# Patient Record
Sex: Male | Born: 1965 | Race: Black or African American | Hispanic: No | Marital: Single
Health system: Southern US, Community
[De-identification: ages and names within clinical notes are randomized; demographics above are authoritative.]

---

## 2015-09-05 ENCOUNTER — Emergency Department (HOSPITAL_COMMUNITY): Payer: Self-pay

## 2015-09-05 ENCOUNTER — Encounter (HOSPITAL_COMMUNITY): Payer: Self-pay | Admitting: *Deleted

## 2015-09-05 ENCOUNTER — Emergency Department (HOSPITAL_COMMUNITY)
Admission: EM | Admit: 2015-09-05 | Discharge: 2015-09-05 | Disposition: A | Discharge status: Home / Self Care | Payer: Self-pay | Attending: Emergency Medicine | Admitting: Emergency Medicine

## 2015-09-05 DIAGNOSIS — M25561 Pain in right knee: Secondary | ICD-10-CM

## 2015-09-05 DIAGNOSIS — M25551 Pain in right hip: Secondary | ICD-10-CM | POA: Insufficient documentation

## 2015-09-05 DIAGNOSIS — Z8781 Personal history of (healed) traumatic fracture: Secondary | ICD-10-CM | POA: Insufficient documentation

## 2015-09-05 DIAGNOSIS — R2241 Localized swelling, mass and lump, right lower limb: Secondary | ICD-10-CM | POA: Insufficient documentation

## 2015-09-05 DIAGNOSIS — M79661 Pain in right lower leg: Secondary | ICD-10-CM | POA: Insufficient documentation

## 2015-09-05 MED ORDER — IBUPROFEN 800 MG PO TABS: 800.0000 mg | ORAL_TABLET | Freq: Three times a day (TID) | ORAL | Status: AC

## 2015-09-05 MED ORDER — IBUPROFEN 400 MG PO TABS
800.0000 mg | ORAL_TABLET | Freq: Once | ORAL | Status: AC
  Administered 2015-09-05: 800 mg via ORAL
  Filled 2015-09-05: qty 2

## 2015-09-05 NOTE — ED Notes (Signed)
Pt is in stable condition upon d/c and ambulates from ED. 

## 2015-09-05 NOTE — ED Notes (Signed)
Patient given ice pack for ankle

## 2015-09-05 NOTE — Discharge Instructions (Signed)
1. Medications: alternate naprosyn or ibuprofen and tylenol for pain control, usual home medications 2. Treatment: rest, ice, elevate and use brace, drink plenty of fluids, gentle stretching 3. Follow Up: Please followup with orthopedics as directed or your PCP in 1 week if no improvement for discussion of your diagnoses and further evaluation after today's visit; if you do not have a primary care doctor use the resource guide provided to find one; Please return to the ER for worsening symptoms or other concerns    Arthralgia Your caregiver has diagnosed you as suffering from an arthralgia. Arthralgia means there is pain in a joint. This can come from many reasons including:  Bruising the joint which causes soreness (inflammation) in the joint.  Wear and tear on the joints which occur as we grow older (osteoarthritis).  Overusing the joint.  Various forms of arthritis.  Infections of the joint. Regardless of the cause of pain in your joint, most of these different pains respond to anti-inflammatory drugs and rest. The exception to this is when a joint is infected, and these cases are treated with antibiotics, if it is a bacterial infection. HOME CARE INSTRUCTIONS   Rest the injured area for as long as directed by your caregiver. Then slowly start using the joint as directed by your caregiver and as the pain allows. Crutches as directed may be useful if the ankles, knees or hips are involved. If the knee was splinted or casted, continue use and care as directed. If an stretchy or elastic wrapping bandage has been applied today, it should be removed and re-applied every 3 to 4 hours. It should not be applied tightly, but firmly enough to keep swelling down. Watch toes and feet for swelling, bluish discoloration, coldness, numbness or excessive pain. If any of these problems (symptoms) occur, remove the ace bandage and re-apply more loosely. If these symptoms persist, contact your caregiver or  return to this location.  For the first 24 hours, keep the injured extremity elevated on pillows while lying down.  Apply ice for 15-20 minutes to the sore joint every couple hours while awake for the first half day. Then 03-04 times per day for the first 48 hours. Put the ice in a plastic bag and place a towel between the bag of ice and your skin.  Wear any splinting, casting, elastic bandage applications, or slings as instructed.  Only take over-the-counter or prescription medicines for pain, discomfort, or fever as directed by your caregiver. Do not use aspirin immediately after the injury unless instructed by your physician. Aspirin can cause increased bleeding and bruising of the tissues.  If you were given crutches, continue to use them as instructed and do not resume weight bearing on the sore joint until instructed. Persistent pain and inability to use the sore joint as directed for more than 2 to 3 days are warning signs indicating that you should see a caregiver for a follow-up visit as soon as possible. Initially, a hairline fracture (break in bone) may not be evident on X-rays. Persistent pain and swelling indicate that further evaluation, non-weight bearing or use of the joint (use of crutches or slings as instructed), or further X-rays are indicated. X-rays may sometimes not show a small fracture until a week or 10 days later. Make a follow-up appointment with your own caregiver or one to whom we have referred you. A radiologist (specialist in reading X-rays) may read your X-rays. Make sure you know how you are to obtain your  X-ray results. Do not assume everything is normal if you do not hear from [place]. SEEK MEDICAL CARE IF: Bruising, swelling, or pain increases. SEEK IMMEDIATE MEDICAL CARE IF:   Your fingers or toes are numb or blue.  The pain is not responding to medications and continues to stay the same or get worse.  The pain in your joint becomes severe.  You develop a fever  over 102 F (38.9 C).  It becomes impossible to move or use the joint. MAKE SURE YOU:   Understand these instructions.  Will watch your condition.  Will get help right away if you are not doing well or get worse. Document Released: 11/24/2005 Document Revised: 02/16/2012 Document Reviewed: 07/12/2008 [facility] Patient Information 2015 [place], [place]. This information is not intended to replace advice given to you by your health care provider. Make sure you discuss any questions you have with your health care provider.    Emergency Department Resource Guide 1) Find a Doctor and Pay Out of Pocket Although you won't have to find out who is covered by your insurance plan, it is a good idea to ask around and get recommendations. You will then need to call the office and see if the doctor you have chosen will accept you as a new patient and what types of options they offer for patients who are self-pay. Some doctors offer discounts or will set up payment plans for their patients who do not have insurance, but you will need to ask so you aren't surprised when you get to your appointment.  2) Contact Your Local Health Department Not all health departments have doctors that can see patients for sick visits, but many do, so it is worth a call to see if yours does. If you don't know where your local health department is, you can check in your phone book. The CDC also has a tool to help you locate your state's health department, and many state websites also have listings of all of their local health departments.  3) Find a Walk-in Clinic If your illness is not likely to be very severe or complicated, you may want to try a walk in clinic. These are popping up all over the country in pharmacies, drugstores, and shopping centers. They're usually staffed by nurse practitioners or physician assistants that have been trained to treat common illnesses and complaints. They're usually fairly quick and  inexpensive. However, if you have serious medical issues or chronic medical problems, these are probably not your best option.  No Primary Care Doctor: - Call Health Connect at  [phone] - they can help you locate a primary care doctor that  accepts your insurance, provides certain services, etc. - Physician Referral Service- [phone]  Chronic Pain Problems: Organization         Address  Phone   Notes  [facility]  [phone] Patients need to be referred by their primary care doctor.   Medication Assistance: Organization         Address  Phone   Notes  [facility] Medication [facility] [address] [place] [place], [place] [address] [phone] --Must be a resident of [facility] -- Must have NO insurance coverage whatsoever (no Medicaid/ Medicare, etc.) -- The pt. MUST have a primary care doctor that directs their care regularly and follows them in the community   [facility]  [phone]   [employer]  [phone]    Agencies that provide inexpensive medical  care: Organization         Address  Phone   Notes  [facility]  [phone]   [facility]    [phone]   [facility] [address] [place], [place] [address] [phone]   [facility] [address] [place] [phone]   Planned Parenthood    [phone]   [facility]    [phone]   [facility]  [address], [place] Phone:  [phone], Fax:  [phone] Hours of Operation:  9 am - 6 pm, M-F.  Also accepts Medicaid/Medicare and self-pay.  [facility]  [address], [place] Phone: [phone], Fax: [phone]. Hours of Operation:  8:30 am - 5:30 pm, M-F.  Also accepts Medicaid and self-pay.  [facility] [place] [address] [place] Phone: [phone]   [facility] [address] Natasha Bence [place], [place] [phone], Ext. [phone] Mondays & Thursdays: 7-9 AM.  First 15 patients are seen on a first come, first serve basis.    Medicaid-accepting [facility] Providers:  Organization         Address  Phone   Notes  [facility] [address],  [phone] Also accepts self-pay patients.  [facility] [address] Laurell Josephs [place], [place]  [phone]   [facility] [address], [place] [phone]   [facility] [address], [place] [phone]   Renaye Rakers [address], [place]   [phone] Only accepts [place] Access [place] patients after they have their name applied to their card.   Self-Pay (no insurance) in [place] [facility]:  Organization         Address  Phone   Notes  Sickle Cell Patients, [facility] [address] [place], [place] [phone]   [facility] [address] [place], [place] [phone]   [facility] [place]  [address], [place] [phone]   [facility]/Dr. Osei-Bonsu  [address], [place] or [address], [place] [phone] Phone number for both [place] and [place] locations is the same.  [facility] [address], [place] [phone]   [facility] [address], [place] or [address] [phone]   [facility] [address], [place] [phone], phone; [phone], fax Sees patients 1st and 3rd Saturday of every month.  Must not qualify for public or private insurance (i.e. Medicaid, Medicare, [place] Health Choice, Veterans' Benefits)  Household income should be no more than 200% of the poverty level The clinic cannot treat you if you are pregnant or  think you are pregnant  Sexually transmitted diseases are not treated at the clinic.    Dental Care: Organization         Address  Phone  Notes  [facility] [address] [place], [place] [phone] Accepts children up to age 34 who are enrolled in [place] or [place] Health Choice; pregnant women with a Medicaid card; and children who have applied for Medicaid or [place] Health Choice, but were  declined, whose parents can pay a reduced fee at time of service.  [facility] Department of [facility]  [address], [place] [phone] Accepts children up to age 2 who are enrolled in [place] or [place] Health Choice; pregnant women with a Medicaid card; and children who have applied for Medicaid or [place] Health Choice, but were declined, whose parents can pay a reduced fee at time of service.  [facility]  [address] [place], [place] [phone] Patients are seen by appointment only. Walk-ins are not accepted. [facility] will see patients 22 years of age and older. Monday - Tuesday (8am-5pm) Most Wednesdays (8:30-5pm) $30 per visit, cash only  [facility] Adult Dental Access PRO[facility]  [address], [facility] [phone] Patients are seen by appointment only. Walk-ins are not accepted. [facility] will see patients 30 years of age and older. One Wednesday Evening (Monthly: Volunteer Based).  $30 per visit, cash only  [employer]  [phone] for adults; Children under age 49, call Graduate Pediatric Dentistry at [phone]. Children aged 33-14, please call [phone] to request a pediatric application.  Dental services are provided in all areas of dental care including fillings, crowns and bridges, complete and partial dentures, implants, gum treatment, root canals, and extractions. Preventive care is also provided. Treatment is provided to both adults  and children. Patients are selected via a lottery and there is often a waiting list.   [employer] [address] [place]  [phone] [email or web address]   [facility] [address] [place], [place] [phone], Ext. [phone] Second and Fourth Thursday of each month, opens at 6:30 AM; Clinic ends at 9 AM.  Patients are seen on a first-come first-served basis, and a limited number are seen during each clinic.   [facility]  [address] Ether Griffins [place], [place] [phone]   Eligibility Requirements You must have lived in [place], [place], or [place] counties for at least the last three months.   You cannot be eligible for state or federal sponsored [place], including [employer], [place], or [employer].   You generally cannot be eligible for healthcare insurance through your employer.    How to apply: Eligibility screenings are held every Tuesday and Wednesday afternoon from 1:00 pm until 4:00 pm. You do not need an appointment for the interview!  [facility] [address] [place], [place] [phone]   [facility] Health Department  [phone]   [facility] Health Department  [phone]   [facility] Health Department  [phone]    Behavioral Health Resources in the Community: Intensive Outpatient Programs Organization         Address  Phone  Notes  [facility] Services [address] [place], [place] [phone]   [facility] Outpatient [address] [place], [place] [phone]   [facility] [address], [place], [place]  [phone]   [facility] Mental Health [address]  [place], [place] [phone] or [phone]   Substance Abuse Resources Organization         Address  Phone  Notes  [facility] Drug Services  [phone]   [facility]  [phone]   [facility] [place]  [phone]     Floydene Flock  [phone]   Residential & Outpatient Substance Abuse Program  [phone]   Psychological Services  Organization         Address  Phone  Notes  [employer] [facility]  [phone]   [facility] Services  [phone]   [facility] [address] [place] [phone] or [phone]    Mobile Crisis Teams Organization         Address  Phone  Notes  [facility], Mobile Crisis Care Unit  [phone]   [facility]  [address] [place], [place] [phone]   Doristine Locks [address] [place] [phone]    Self-Help/Support Groups Organization         Address  Phone             Notes  [facility] [place] - variety of support groups  [phone]- I7437963 Call for more information  Narcotics Anonymous (NA), [facility] [address], [employer] [place]  2 meetings at this location   Statistician         Address  Phone  Notes  [facility] [address],    [place]  [phone]   [facility]  [address] [place] [address], [place], [place] [phone]   [facility] [address] [place], [place] [phone] Admissions: 8am-3pm M-F  [facility] [address],    [place], [place] [phone]   [facility] [address] [place], [place], [place] [phone]   [facility] [address]  [place], [place] [phone]   [facility] - Intensive Outpatient [address] Laurell Josephs [address], [place], [place] [phone]   [facility] ([facility]iction [facility] [address] [place].,  [place], [place] [phone] or [phone]   [facility] ([facility]) [address], [place], [place] [phone] Accepts Medicaid  [facility] [place] [address]  [place] [phone] Substance Abuse/Addiction Treatment   [facility] Organization         Address  Phone  Notes  [facility]  [phone]   Angie Fava, PhD [address] Ervin Knack [place], [place]   [phone] or [phone]   [facility] Behavioral   [address] [place], [place] [phone]   [facility] [address], [place], [place] [phone] Insurance/Medicaid/sponsorship through [facility] [address]                                    [place], [place] [phone] Therapy/tele-psych/case  [facility] [address], [place] [phone]    Dr. Lolly Mustache  [phone]   [facility] [place] [facility]  [employer] [facility] Dept. 1) [address] [place] 2) [address], [place] 3)  [address], [place] [phone]  [phone]   [facility] Child Abuse Hotline (205)486-2343 or [phone] (After Hours)

## 2015-09-05 NOTE — ED Provider Notes (Signed)
CSN: [id number]     Arrival date & time 09/05/15  0901 History  By signing my name below, I, Tanner Schwartz, attest that this documentation has been prepared under the direction and in the presence of non-physician practitioner, Dierdre Forth, PA-C.  Electronically Signed: Freida Schwartz, Scribe. 09/05/2015. 11:04 AM.    Chief Complaint  Patient presents with  . Ankle Pain  . Leg Pain    The history is provided by the patient. No language interpreter was used.     HPI Comments:  Tanner Schwartz is a 49 y.o. male who presents to the Emergency Department complaining of  shooting pain and swelling to his RLE for ~1.5 weeks. His pain has progressively worsened since onset and is exacerbated when ambulating. He reports a history of fracture to the extremity requiring pins years ago. Pt denies recent travel/long periods of immobilization and acute injury to the extremity.  He also denies pain in his knee, fever, chills, nausea and vomiting. He has been taking advil and tylenol with mild relief. He reports localized swelling to the anterior shin where his pain is, but denies edema of the leg.    History reviewed. No pertinent past medical history. History reviewed. No pertinent past surgical history. History reviewed. No pertinent family history. Social History  Substance Use Topics  . Smoking status: Unknown If Ever Smoked  . Smokeless tobacco: None  . Alcohol Use: No    Review of Systems  Constitutional: Negative for fever and chills.  Cardiovascular: Positive for leg swelling (RLE).  Gastrointestinal: Negative for nausea and vomiting.  Musculoskeletal: Positive for myalgias (RLE).      Allergies  Review of patient's allergies indicates no known allergies.  Home Medications   Prior to Admission medications   Medication Sig Start Date End Date Taking? Authorizing Provider  ibuprofen (ADVIL,MOTRIN) 800 MG tablet Take 1 tablet (800 mg total) by mouth 3 (three) times daily.  09/05/15   Hannah Muthersbaugh, PA-C   BP 143/94 mmHg  Pulse 80  Temp(Src) 98.7 F (37.1 C) (Oral)  Resp 18  Ht  (1.803 m)  Wt 180 lb (81.647 kg)  BMI 25.12 kg/m2  SpO2 93% Physical Exam  Constitutional: He appears well-developed and well-nourished. No distress.  HENT:  Head: Normocephalic and atraumatic.  Eyes: Conjunctivae are normal.  Neck: Normal range of motion.  Cardiovascular: Normal rate, regular rhythm and intact distal pulses.   Capillary refill < 3 sec  Pulmonary/Chest: Effort normal and breath sounds normal.  Musculoskeletal: He exhibits tenderness. He exhibits no edema.  FROM: right hip, knee, and ankle  Multiple well healed surgical scars over the medial and lateral portion of the right ankle; No erythema or swelling Palpable swelling of the lateral and anterior tibia with TTP; No overlying erythema or induration No tenderness to palpation of the calf, no palpable cord, negative Homans sign No peripheral edema   Neurological: He is alert. Coordination normal.  Sensation intact to dull and shrap in RLE Strength 5/5 with flexion/extension of the right knee, dorsal and plantar of the right ankle   Skin: Skin is warm and dry. He is not diaphoretic.  No tenting of the skin  Psychiatric: He has a normal mood and affect.  Nursing note and vitals reviewed.   ED Course  Procedures   DIAGNOSTIC STUDIES:  Oxygen Saturation is 93% on RA, adequate by my interpretation.    COORDINATION OF CARE:  10:20 AM Discussed treatment plan with pt at bedside and pt agreed to  plan.  Labs Review Labs Reviewed - No data to display  Imaging Review Dg Tibia/fibula Right  09/05/2015   CLINICAL DATA:  Pain, swelling in lower leg. Lump for 2 weeks. Remote injury. No recent injury.  EXAM: RIGHT TIBIA AND FIBULA - 2 VIEW  COMPARISON:  Right ankle series performed today.  FINDINGS: Evidence of old healed injury in the distal tibia and fibula. No acute fracture, subluxation or  dislocation. Degenerative changes in the right ankle.  IMPRESSION: No acute bony abnormality.   Electronically Signed   By: Charlett Nose M.D.   On: 09/05/2015 11:01   Dg Ankle Complete Right  09/05/2015   CLINICAL DATA:  Right ankle swelling and pain. No recent injury. Initial encounter.  EXAM: RIGHT ANKLE - COMPLETE 3+ VIEW  COMPARISON:  None.  FINDINGS: No acute bony or joint abnormality is identified. Ossification of the syndesmosis between the distal tibia and fibula is consistent with old trauma. Small well corticated bony fragments off of the medial malleolus are also consistent with old injury. Lucency is seen in the medial talar dome most consistent with an osteochondral defect. No acute bony or joint abnormality is seen. Soft tissues are unremarkable.  IMPRESSION: No acute abnormality.  Remote posttraumatic change about the ankle.  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   Electronically Signed   By: Drusilla Kanner M.D.   On: 09/05/2015 10:01   I have personally reviewed and evaluated these images as part of my medical decision-making.   EKG Interpretation None      MDM   Final diagnoses:  Arthralgia of right lower leg   Tanner Schwartz presents with pain to the RLE; no trauma.  Patient X-Ray negative for obvious fracture or dislocation. Pain managed in ED. Pt advised to follow up with orthopedics if symptoms persist for possibility of missed fracture diagnosis. Patient given brace while in ED, conservative therapy recommended and discussed. Patient will be dc home & is agreeable with above plan.  BP 143/94 mmHg  Pulse 80  Temp(Src) 98.7 F (37.1 C) (Oral)  Resp 18  Ht  (1.803 m)  Wt 180 lb (81.647 kg)  BMI 25.12 kg/m2  SpO2 93%   Dierdre Forth, PA-C 09/05/15 1149  Laurence Spates, MD 09/05/15 1158

## 2015-09-05 NOTE — ED Notes (Signed)
Patient returned from xray.

## 2015-09-05 NOTE — ED Notes (Signed)
Patient transported to X-ray 

## 2015-09-05 NOTE — ED Notes (Signed)
Pt reports hx of injury to right ankle. Having return of pain and swelling to ankle and leg for several days. Denies new injury. Ambulatory at triage.

## 2016-03-20 IMAGING — DX DG ANKLE COMPLETE 3+V*R*
3 series · 3 of 3 positions shown · non-contrast
Comparison: None.

CLINICAL DATA: Right ankle swelling and pain. No recent injury.
Initial encounter.

EXAM:
RIGHT ANKLE - COMPLETE 3+ VIEW

[x ankle ap right]
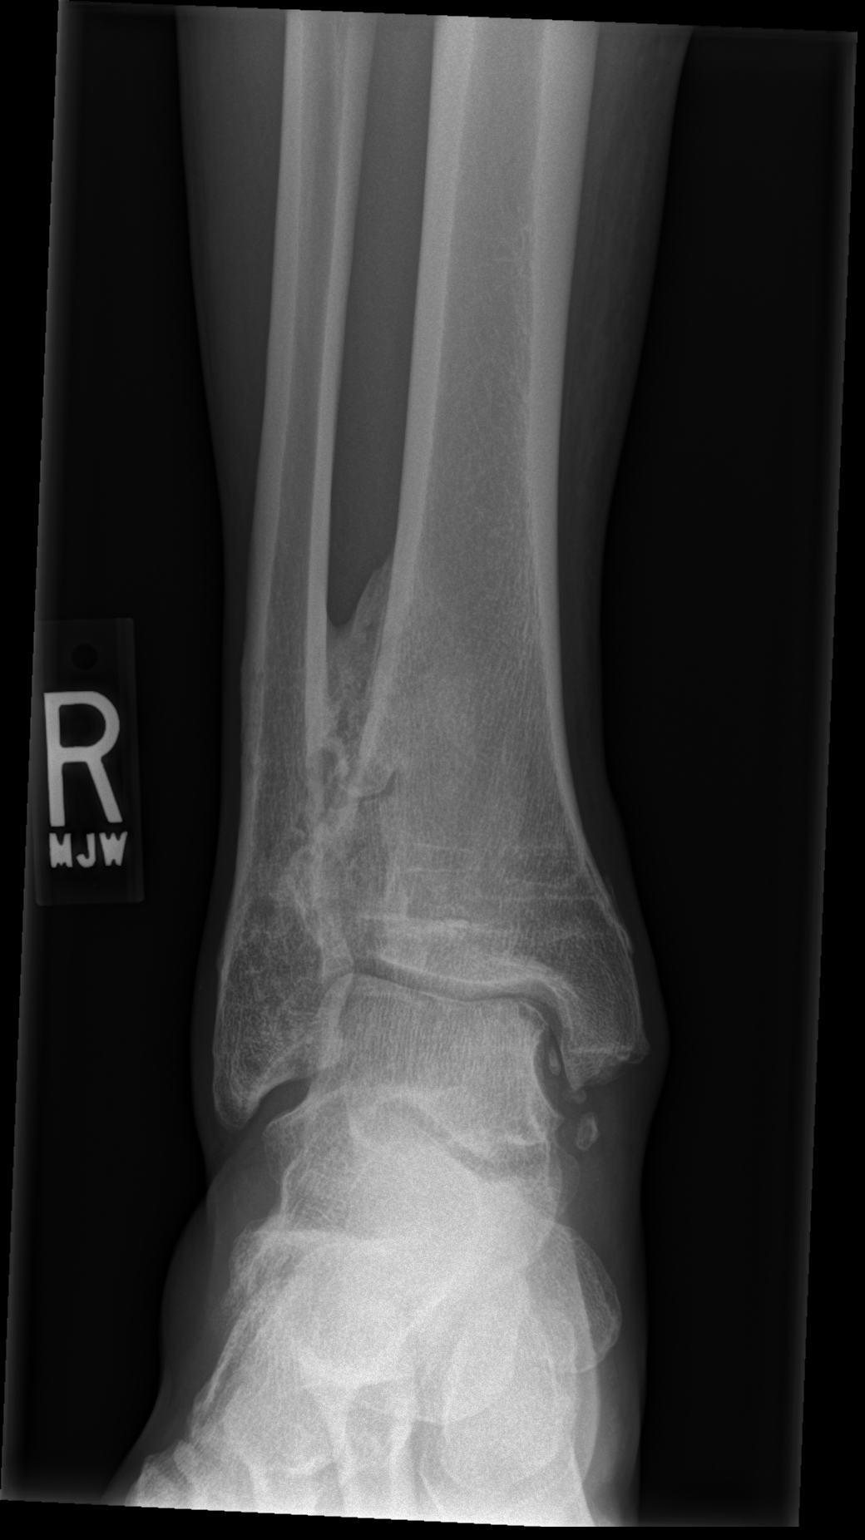

[x ankle obl right]
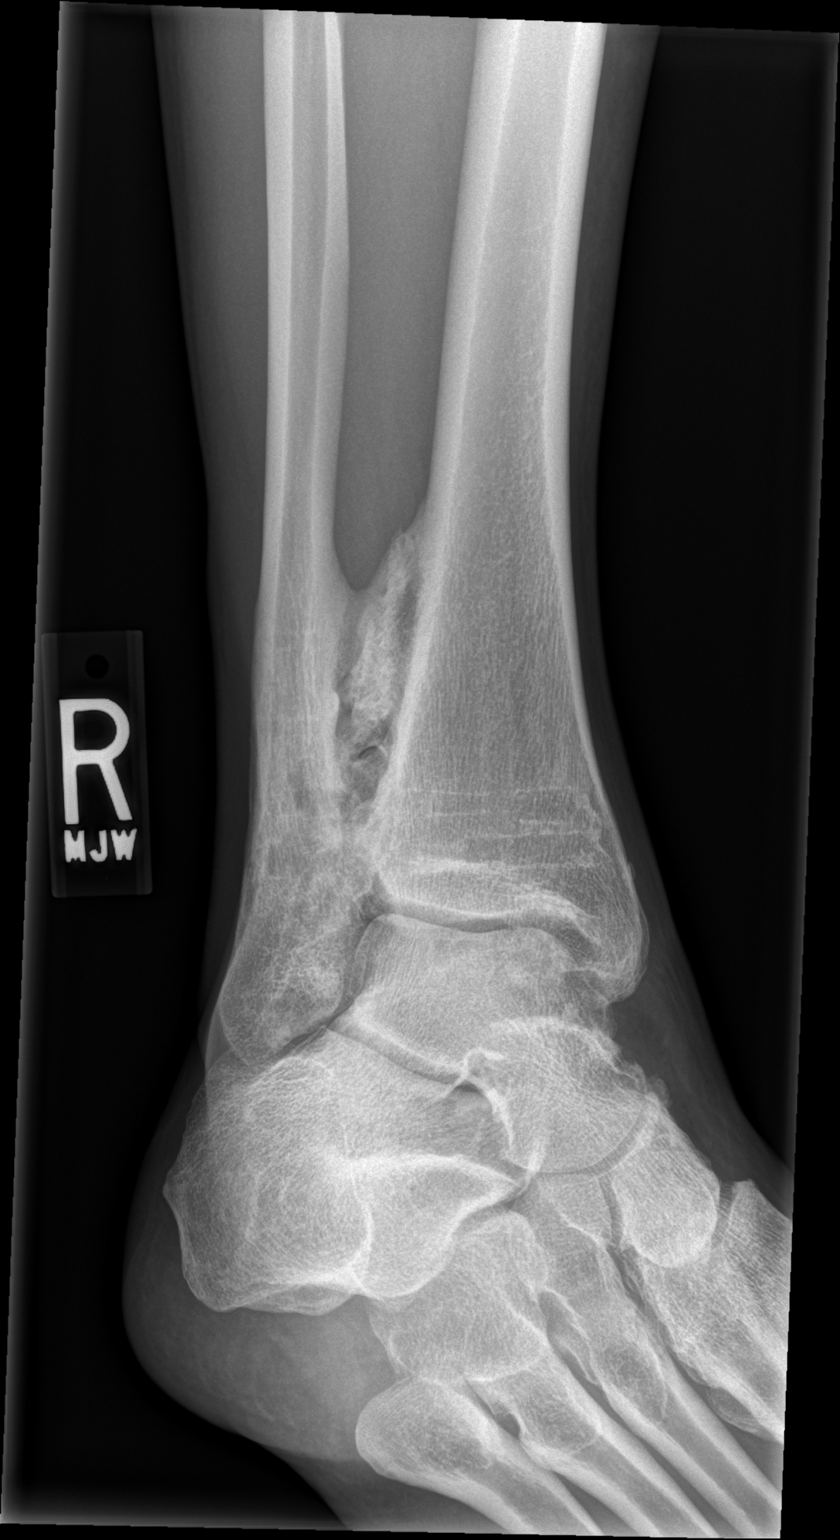

[x ankle lat right]
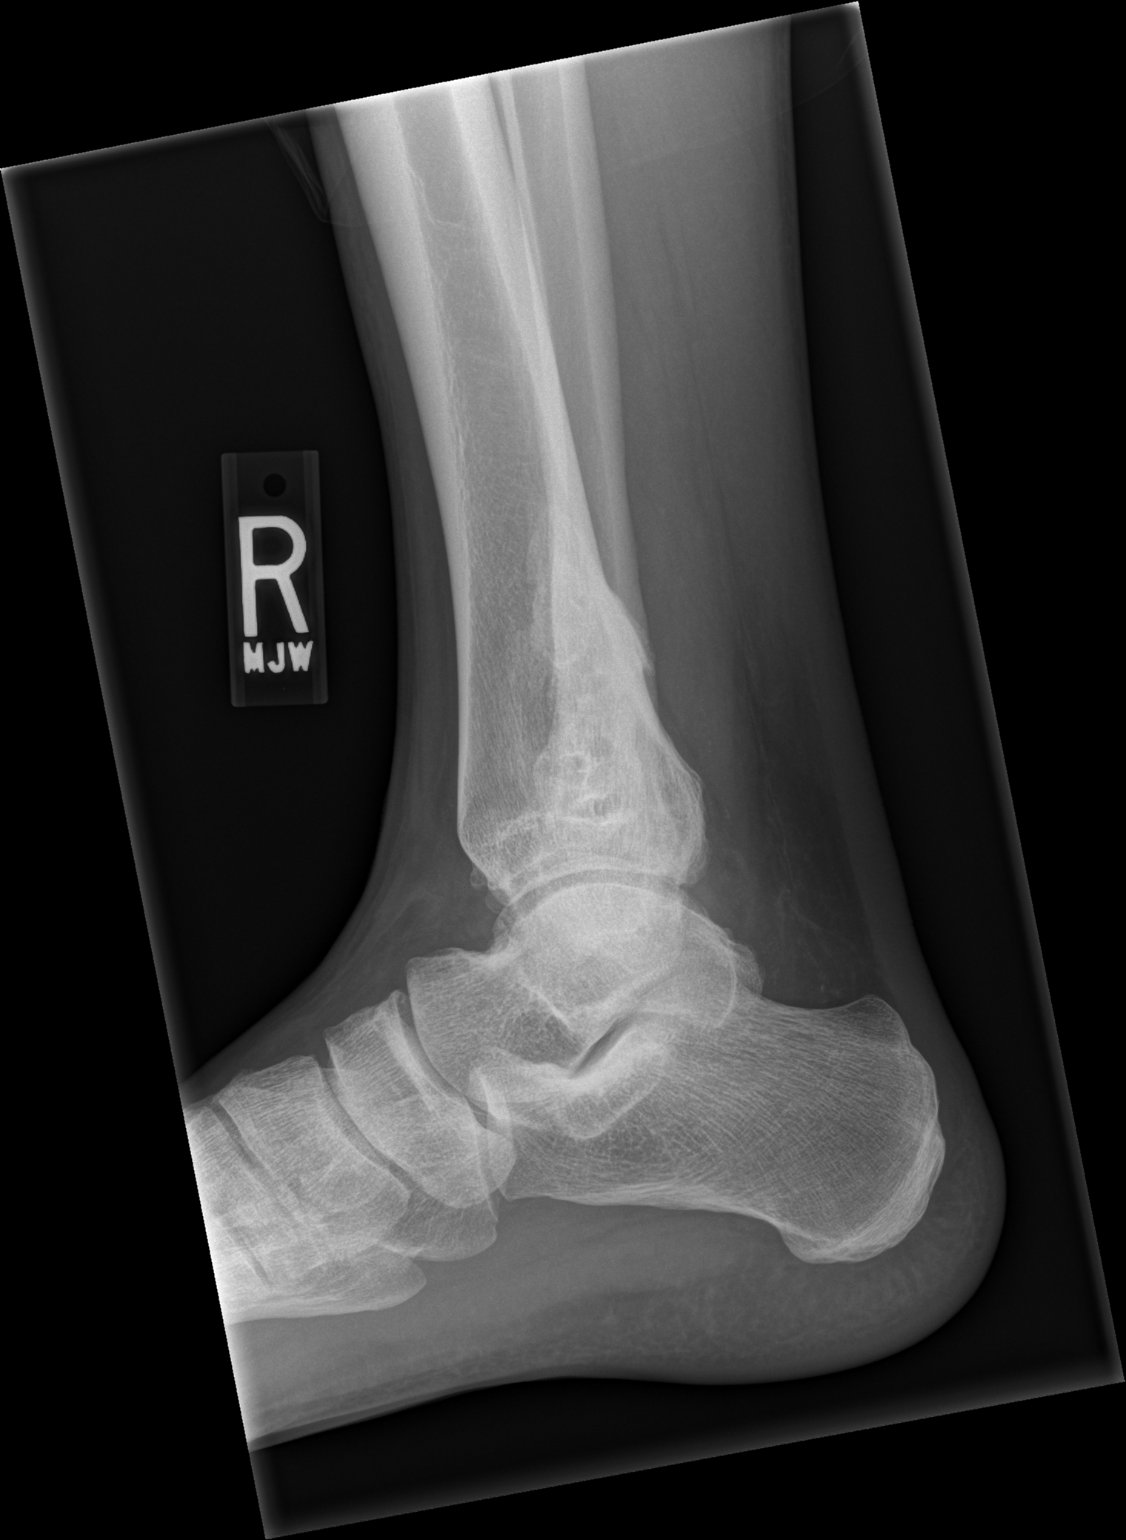

[3 of 3 positions shown; findings below may reference images not displayed]

FINDINGS: No acute bony or joint abnormality is identified. Ossification of
the syndesmosis between the distal tibia and fibula is consistent
with old trauma. Small well corticated bony fragments off of the
medial malleolus are also consistent with old injury. Lucency is
seen in the medial talar dome most consistent with an osteochondral
defect. No acute bony or joint abnormality is seen. Soft tissues are
unremarkable.
IMPRESSION: No acute abnormality.

Remote posttraumatic change about the ankle.

Likely osteochondral lesion medial talar dome.
